# Patient Record
Sex: Male | Born: 2000 | Race: Black or African American | Hispanic: No | Marital: Single
Health system: Southern US, Community
[De-identification: ages and names within clinical notes are randomized; demographics above are authoritative.]

## PROBLEM LIST (undated history)

## (undated) ENCOUNTER — Emergency Department (HOSPITAL_COMMUNITY): Admission: EM | Payer: Medicaid Other | Source: Home / Self Care

## (undated) DIAGNOSIS — F909 Attention-deficit hyperactivity disorder, unspecified type: Secondary | ICD-10-CM

## (undated) DIAGNOSIS — J45909 Unspecified asthma, uncomplicated: Secondary | ICD-10-CM

## (undated) HISTORY — PX: FOOT SURGERY: SHX648

---
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
## 2020-07-27 ENCOUNTER — Other Ambulatory Visit: Payer: Self-pay

## 2023-11-12 ENCOUNTER — Other Ambulatory Visit: Payer: Self-pay

## 2023-11-12 ENCOUNTER — Ambulatory Visit
Admission: EM | Admit: 2023-11-12 | Discharge: 2023-11-12 | Disposition: A | Discharge status: Home / Self Care | Payer: Self-pay | Attending: Family Medicine | Admitting: Family Medicine

## 2023-11-12 DIAGNOSIS — S60111A Contusion of right thumb with damage to nail, initial encounter: Secondary | ICD-10-CM

## 2023-11-12 MED ORDER — CEPHALEXIN 500 MG PO CAPS: 500.0000 mg | ORAL_CAPSULE | Freq: Three times a day (TID) | ORAL | 0 refills | Status: AC

## 2023-11-12 MED ORDER — CEPHALEXIN 500 MG PO CAPS: 500.0000 mg | ORAL_CAPSULE | Freq: Three times a day (TID) | ORAL | 0 refills | Status: DC

## 2023-11-12 NOTE — ED Provider Notes (Signed)
 [facility]    CSN: [id number] Arrival date & time: 11/12/23  1540      History   Chief Complaint No chief complaint on file.   HPI Devin Small is a 23 y.o. male presents for thumb injury.  Patient reports yesterday he slammed his right thumb in a car door.  He states since then he has been having pain and blood under the nail causing pressure.  He denies numbness or tingling.  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  He is not on blood thinning medications.  He has been taking Tylenol and ibuprofen  for pain.  No other concerns at this time.  HPI  Past Medical History:  Diagnosis Date   ADHD    Asthma     Patient Active Problem List   Diagnosis Date Noted   Snoring 02/06/2019   Morbid obesity ([facility]) 02/06/2019   Chronic daily headache 05/15/2018   Tension headache 05/15/2018   Attention deficit hyperactivity disorder (ADHD), combined type 05/15/2018   Sleeping difficulty 05/15/2018    Past Surgical History:  Procedure Laterality Date   FOOT SURGERY         Home Medications    Prior to Admission medications   Medication Sig Start Date End Date Taking? Authorizing Provider  cephALEXin (KEFLEX) 500 MG capsule Take 1 capsule (500 mg total) by mouth 3 (three) times daily for 5 days. 11/12/23 11/17/23 Yes Alleen Arbour, NP  albuterol  (VENTOLIN  HFA) 108 (90 Base) MCG/ACT inhaler Inhale 2 puffs into the lungs as needed. For cough 08/16/15   [provider]  albuterol  (VENTOLIN  HFA) 108 (90 Base) MCG/ACT inhaler Inhale into the lungs. 08/16/15   [provider]  amphetamine-dextroamphetamine (ADDERALL XR) 15 MG 24 hr capsule Take 15 mg by mouth daily. Patient only takes this medication during the academic year 09/06/15   [provider]  beclomethasone (QVAR) 40 MCG/ACT inhaler Inhale 2 puffs into the lungs daily. 09/22/15   [provider]  cetirizine (ZYRTEC) 10 MG tablet Take 10 mg by mouth daily. 10/21/15    [provider]  cloNIDine  (CATAPRES ) 0.1 MG tablet TAKE 2 TABLETS BY MOUTH EVERY NIGHT 07/21/18   Ventura Gins, MD  Coenzyme Q10 (COQ-10) 100 MG CAPS Take 1 capsule by mouth 2 (two) times daily. Patient not taking: Reported on 05/15/2018 08/22/17   Calvert Caul, MD  esomeprazole (NEXIUM) 40 MG capsule TAKE 1 CAPSULE(40 MG) twice a day 05/10/17   [provider]  fluticasone (FLONASE) 50 MCG/ACT nasal spray 1 spray by Each Nare route 2 times daily. 06/22/16   [provider]  fluticasone (FLOVENT HFA) 44 MCG/ACT inhaler INHALE TWO PUFFS BY MOUTH TWICE DAILY FOR  ASTHMA  EXACERBATIONS 03/20/17   [provider]  guaiFENesin -codeine  100-10 MG/5ML syrup Take 5 mLs by mouth 3 (three) times daily as needed for cough. Patient not taking: Reported on 05/15/2018 09/02/15   Tapia, Leisa, PA-C  ibuprofen  (ADVIL ,MOTRIN ) 400 MG tablet Take 1 tablet (400 mg total) by mouth every 6 (six) hours as needed. Patient taking differently: Take 400 mg by mouth every 6 (six) hours as needed for moderate pain (pain score 4-6). 09/02/15   Tapia, Leisa, PA-C  levOCARNitine  (CARNITOR ) 330 MG tablet Take 3 tablets (990 mg total) by mouth 2 (two) times daily. Patient not taking: Reported on 05/15/2018 08/22/17   Calvert Caul, MD  lisdexamfetamine (VYVANSE) 20 MG capsule Take by mouth. 07/22/17   [provider]  magnesium  oxide (  MAG-OX) 400 MG tablet Take 1 tablet (400 mg total) by mouth 2 (two) times daily. Patient not taking: Reported on 05/15/2018 08/22/17   Calvert Caul, MD  Melatonin 5 MG CAPS Take 5 mg by mouth as needed. sleep    [provider]  mometasone (NASONEX) 50 MCG/ACT nasal spray Place 1 spray into the nose as needed. allergies 08/16/15   [provider]  montelukast (SINGULAIR) 10 MG tablet Take 10 mg by mouth daily. 10/21/15   [provider]  naproxen (NAPROSYN) 500 MG tablet Take by mouth. 06/18/16   [provider]  polyethylene glycol  powder (MIRALAX ) powder Mix 6 capfuls in a large gatorade on day 1, then mix 1 cap in 8 oz liquid daily for constipation Patient not taking: Reported on 02/06/2019 04/11/17   Vedia Geralds, NP  topiramate  (TOPAMAX ) 50 MG tablet Take 1 tablet in a.m. and 2 tablets in p.m. p.o. Patient not taking: Reported on 02/06/2019 07/21/18   Ventura Gins, MD  traZODone  (DESYREL ) 100 MG tablet Take 1 tablet (100 mg total) by mouth at bedtime. (Start with 50 mg or half a tablet every night for the first week) 02/06/19   Ventura Gins, MD    Family History Family History  Problem Relation Age of Onset   Migraines Mother    ADD / ADHD Brother    Migraines Maternal Grandmother    Seizures Neg Hx    Autism Neg Hx    Anxiety disorder Neg Hx    Depression Neg Hx    Bipolar disorder Neg Hx    Schizophrenia Neg Hx     Social History Social History   Tobacco Use   Smoking status: Passive Smoke Exposure - Never Smoker   Smokeless tobacco: Never  Substance Use Topics   Alcohol use: Never   Drug use: Never     Allergies   Patient has no known allergies.   Review of Systems Review of Systems  Musculoskeletal:        Right thumb injury     Physical Exam Triage Vital Signs ED Triage Vitals  Encounter Vitals Group     BP 11/12/23 1553 (!) 149/94     Systolic BP Percentile --      Diastolic BP Percentile --      Pulse Rate 11/12/23 1553 80     Resp 11/12/23 1553 17     Temp 11/12/23 1553 (!) 97.4 F (36.3 C)     Temp Source 11/12/23 1553 Oral     SpO2 11/12/23 1553 96 %     Weight --      Height --      Head Circumference --      Peak Flow --      Pain Score 11/12/23 1550 7     Pain Loc --      Pain Education --      Exclude from Growth Chart --    No data found.  Updated Vital Signs BP (!) 149/94   Pulse 80   Temp (!) 97.4 F (36.3 C) (Oral)   Resp 17   SpO2 96%   Visual Acuity Right Eye Distance:   Left Eye Distance:   Bilateral Distance:    Right Eye Near:    Left Eye Near:    Bilateral Near:     Physical Exam Vitals and nursing note reviewed.  Constitutional:      General: He is not in acute distress.    Appearance: Normal appearance.  He is not ill-appearing.  HENT:     Head: Normocephalic and atraumatic.  Eyes:     Pupils: Pupils are equal, round, and reactive to light.  Cardiovascular:     Rate and Rhythm: Normal rate.  Pulmonary:     Effort: Pulmonary effort is normal.  Musculoskeletal:     Comments: There is tenderness with palpation from the right thumb MCP joint to the distal thumb.  Nail is intact with small subungual noted.  Minimal swelling of the finger.  Skin:    General: Skin is warm and dry.  Neurological:     General: No focal deficit present.     Mental Status: He is alert and oriented to person, place, and time.  Psychiatric:        Mood and Affect: Mood normal.        Behavior: Behavior normal.      UC Treatments / Results  Labs (all labs ordered are listed, but only abnormal results are displayed) Labs Reviewed - No data to display  EKG   Radiology No results found.  Procedures Incision and Drainage  Date/Time: 11/12/2023 4:35 PM  Performed by: Alleen Arbour, NP Authorized by: Alleen Arbour, NP   Consent:    Consent obtained:  Verbal   Consent given by:  Patient   Risks discussed:  Bleeding, incomplete drainage, pain and infection   Alternatives discussed:  Referral Universal protocol:    Patient identity confirmed:  Verbally with patient Location:    Type:  Subungual hematoma   Location:  Upper extremity   Upper extremity location:  Finger   Finger location:  R thumb Pre-procedure details:    Skin preparation:  Povidone-iodine Sedation:    Sedation type:  None Anesthesia:    Anesthesia method:  Local infiltration   Local anesthetic:  Lidocaine 1% w/o epi Procedure type:    Complexity:  Simple Procedure details:    Incision types:  Single straight (Cautery device used to puncture  nail)   Drainage:  Bloody   Drainage amount:  Moderate   Wound treatment:  Wound left open   Packing materials:  None Post-procedure details:    Procedure completion:  Tolerated well, no immediate complications  (including critical care time)  Medications Ordered in UC Medications - No data to display  Initial Impression / Assessment and Plan / UC Course  I have reviewed the triage vital signs and the nursing notes.  Pertinent labs & imaging results that were available during my care of the patient were reviewed by me and considered in my medical decision making (see chart for details).     Discussed with patient x-ray indicated to confirm a fracture.  He declines this due to financial concerns.  He prefers just to have it drained.  Discussed with Dr. Barbar Bonus.  Successful drainage of subungual.  As unable to perform x-ray will start Keflex prophylactically.  Advised to keep area clean and dry.  May continue elevation ice and over-the-counter analgesics as needed.  PCP follow-up 2 to 3 days for recheck.  ER precautions reviewed and patient verbalized understanding Final Clinical Impressions(s) / UC Diagnoses   Final diagnoses:  Subungual hematoma of right thumb, initial encounter     Discharge Instructions      And nail may continue to drain throughout the day.  Just keep it clean and dry.  Start Keflex 3 times a day for 5 days to prevent infection.  Elevate ice and you may continue Tylenol or  ibuprofen  as needed for pain.  Please follow-up with your PCP in 2 to 3 days for recheck.  Please go to the ER for any worsening symptoms.  Hope you feel better soon!     ED Prescriptions     Medication Sig Dispense Auth. Provider   cephALEXin (KEFLEX) 500 MG capsule Take 1 capsule (500 mg total) by mouth 3 (three) times daily for 5 days. 15 capsule Cire Deyarmin, Jodi R, NP      PDMP not reviewed this encounter.   Alleen Arbour, NP 11/12/23 [phone]

## 2023-11-12 NOTE — Discharge Instructions (Addendum)
 And nail may continue to drain throughout the day.  Just keep it clean and dry.  Start Keflex 3 times a day for 5 days to prevent infection.  Elevate ice and you may continue Tylenol or ibuprofen  as needed for pain.  Please follow-up with your PCP in 2 to 3 days for recheck.  Please go to the ER for any worsening symptoms.  Hope you feel better soon!

## 2023-11-12 NOTE — ED Triage Notes (Signed)
 Pt states he slammed his car door on his right thumb accidentally yesterday. Half of the thumb nail is black. Pt states he tried to relieve the pressure from the blood under his nail with a paper clip and a sewing needles.
